# Patient Record
Sex: Male | Born: 1945 | Race: White | Hispanic: No | Marital: Married | Smoking: Former smoker
Health system: Southern US, Community
[De-identification: ages and names within clinical notes are randomized; demographics above are authoritative.]

## PROBLEM LIST (undated history)

## (undated) DIAGNOSIS — M1990 Unspecified osteoarthritis, unspecified site: Secondary | ICD-10-CM

## (undated) HISTORY — PX: PARTIAL COLECTOMY: SHX5273

## (undated) HISTORY — PX: JOINT REPLACEMENT: SHX530

## (undated) HISTORY — PX: TOTAL HIP ARTHROPLASTY: SHX124

---

## 1999-02-10 ENCOUNTER — Encounter: Payer: Self-pay | Admitting: *Deleted

## 1999-02-10 ENCOUNTER — Inpatient Hospital Stay (HOSPITAL_COMMUNITY): Admission: EM | Admit: 1999-02-10 | Discharge: 1999-02-13 | Payer: Self-pay | Admitting: *Deleted

## 1999-02-12 ENCOUNTER — Encounter: Payer: Self-pay | Admitting: *Deleted

## 1999-02-15 ENCOUNTER — Encounter: Payer: Self-pay | Admitting: Gastroenterology

## 1999-02-15 ENCOUNTER — Inpatient Hospital Stay (HOSPITAL_COMMUNITY): Admission: RE | Admit: 1999-02-15 | Discharge: 1999-02-20 | Payer: Self-pay | Admitting: *Deleted

## 1999-02-15 ENCOUNTER — Encounter: Payer: Self-pay | Admitting: *Deleted

## 1999-02-17 ENCOUNTER — Encounter: Payer: Self-pay | Admitting: *Deleted

## 1999-03-07 ENCOUNTER — Ambulatory Visit (HOSPITAL_COMMUNITY): Admission: RE | Admit: 1999-03-07 | Discharge: 1999-03-07 | Payer: Self-pay | Admitting: *Deleted

## 1999-03-20 ENCOUNTER — Inpatient Hospital Stay (HOSPITAL_COMMUNITY): Admission: RE | Admit: 1999-03-20 | Discharge: 1999-03-26 | Payer: Self-pay | Admitting: *Deleted

## 1999-03-29 ENCOUNTER — Inpatient Hospital Stay (HOSPITAL_COMMUNITY): Admission: EM | Admit: 1999-03-29 | Discharge: 1999-04-01 | Payer: Self-pay | Admitting: Emergency Medicine

## 1999-03-29 ENCOUNTER — Encounter: Payer: Self-pay | Admitting: General Surgery

## 1999-03-30 ENCOUNTER — Encounter: Payer: Self-pay | Admitting: General Surgery

## 1999-03-30 ENCOUNTER — Encounter: Payer: Self-pay | Admitting: Surgery

## 2000-12-23 ENCOUNTER — Other Ambulatory Visit: Admission: RE | Admit: 2000-12-23 | Discharge: 2000-12-23 | Payer: Self-pay | Admitting: Internal Medicine

## 2009-01-01 ENCOUNTER — Encounter: Admission: RE | Admit: 2009-01-01 | Discharge: 2009-01-01 | Payer: Self-pay | Admitting: Neurological Surgery

## 2009-03-12 ENCOUNTER — Encounter: Admission: RE | Admit: 2009-03-12 | Discharge: 2009-03-12 | Payer: Self-pay | Admitting: Internal Medicine

## 2010-06-03 ENCOUNTER — Encounter: Admission: RE | Admit: 2010-06-03 | Discharge: 2010-06-03 | Payer: Self-pay | Admitting: Neurological Surgery

## 2010-07-24 ENCOUNTER — Ambulatory Visit: Payer: Self-pay

## 2010-10-19 HISTORY — PX: TOTAL HIP ARTHROPLASTY: SHX124

## 2010-10-28 ENCOUNTER — Ambulatory Visit: Payer: Self-pay | Admitting: Unknown Physician Specialty

## 2010-10-30 ENCOUNTER — Inpatient Hospital Stay: Payer: Self-pay | Admitting: Unknown Physician Specialty

## 2014-06-27 ENCOUNTER — Other Ambulatory Visit: Payer: Self-pay | Admitting: Gastroenterology

## 2014-09-11 ENCOUNTER — Encounter (HOSPITAL_COMMUNITY): Payer: Self-pay | Admitting: *Deleted

## 2014-09-25 ENCOUNTER — Ambulatory Visit (HOSPITAL_COMMUNITY): Payer: Commercial Managed Care - HMO | Admitting: Anesthesiology

## 2014-09-25 ENCOUNTER — Encounter (HOSPITAL_COMMUNITY)
Admission: RE | Disposition: A | Discharge status: Home / Self Care | Payer: Medicare HMO | Source: Ambulatory Visit | Attending: Gastroenterology

## 2014-09-25 ENCOUNTER — Encounter (HOSPITAL_COMMUNITY): Payer: Self-pay | Admitting: *Deleted

## 2014-09-25 ENCOUNTER — Ambulatory Visit (HOSPITAL_COMMUNITY)
Admission: RE | Admit: 2014-09-25 | Discharge: 2014-09-25 | Disposition: A | Discharge status: Home / Self Care | Payer: Commercial Managed Care - HMO | Source: Ambulatory Visit | Attending: Gastroenterology | Admitting: Gastroenterology

## 2014-09-25 DIAGNOSIS — E780 Pure hypercholesterolemia: Secondary | ICD-10-CM | POA: Insufficient documentation

## 2014-09-25 DIAGNOSIS — M1990 Unspecified osteoarthritis, unspecified site: Secondary | ICD-10-CM | POA: Insufficient documentation

## 2014-09-25 DIAGNOSIS — Z87891 Personal history of nicotine dependence: Secondary | ICD-10-CM | POA: Diagnosis not present

## 2014-09-25 DIAGNOSIS — K5790 Diverticulosis of intestine, part unspecified, without perforation or abscess without bleeding: Secondary | ICD-10-CM | POA: Diagnosis not present

## 2014-09-25 DIAGNOSIS — Z1211 Encounter for screening for malignant neoplasm of colon: Secondary | ICD-10-CM | POA: Diagnosis present

## 2014-09-25 DIAGNOSIS — Z7982 Long term (current) use of aspirin: Secondary | ICD-10-CM | POA: Diagnosis not present

## 2014-09-25 DIAGNOSIS — D123 Benign neoplasm of transverse colon: Secondary | ICD-10-CM | POA: Insufficient documentation

## 2014-09-25 HISTORY — DX: Unspecified osteoarthritis, unspecified site: M19.90

## 2014-09-25 HISTORY — PX: COLONOSCOPY WITH PROPOFOL: SHX5780

## 2014-09-25 SURGERY — COLONOSCOPY WITH PROPOFOL
Anesthesia: Monitor Anesthesia Care

## 2014-09-25 MED ORDER — LACTATED RINGERS IV SOLN
INTRAVENOUS | Status: DC
  Administered 2014-09-25: 1000 mL via INTRAVENOUS

## 2014-09-25 MED ORDER — PROPOFOL 10 MG/ML IV BOLUS
INTRAVENOUS | Status: AC
  Filled 2014-09-25: qty 20

## 2014-09-25 MED ORDER — PROPOFOL 10 MG/ML IV EMUL
INTRAVENOUS | Status: DC | PRN
  Administered 2014-09-25: 50 mg via INTRAVENOUS

## 2014-09-25 MED ORDER — SODIUM CHLORIDE 0.9 % IV SOLN: INTRAVENOUS | Status: DC

## 2014-09-25 MED ORDER — LIDOCAINE HCL (CARDIAC) 20 MG/ML IV SOLN
INTRAVENOUS | Status: AC
Start: 2014-09-25 — End: 2014-09-25
  Filled 2014-09-25: qty 5

## 2014-09-25 MED ORDER — LACTATED RINGERS IV SOLN
INTRAVENOUS | Status: DC | PRN
  Administered 2014-09-25: 12:00:00 via INTRAVENOUS

## 2014-09-25 MED ORDER — PROPOFOL INFUSION 10 MG/ML OPTIME
INTRAVENOUS | Status: DC | PRN
  Administered 2014-09-25: 140 ug/kg/min via INTRAVENOUS

## 2014-09-25 SURGICAL SUPPLY — 21 items

## 2014-09-25 NOTE — H&P (Signed)
  Procedure: Surveillance colonoscopy. Colonoscopy with removal of a 3 mm ascending colon adenomatous polyp performed on 05/20/2009. Sigmoid colectomy for diverticular abscess performed in 2000  History: The patient is a 68 year old male born Jan 26, 1946. He is scheduled to undergo surveillance colonoscopy with polypectomy to prevent colon cancer.  Medication allergies: None  Past medical history: Hypercholesterolemia. Right hip replacement surgery in September 1999. Diffuse lumbar disc disease. Sigmoid colectomy to treat diverticular abscess. Bilateral hip replacement surgery.  Exam: The patient is alert and lying comfortably on the endoscopy stretcher. Abdomen is soft and nontender to palpation. Lungs are clear to auscultation. Cardiac exam reveals a regular rhythm.  Plan: Proceed with surveillance colonoscopy

## 2014-09-25 NOTE — Transfer of Care (Signed)
Immediate Anesthesia Transfer of Care Note  Patient: Antonio Ewing  Procedure(s) Performed: Procedure(s): COLONOSCOPY WITH PROPOFOL (N/A)  Patient Location: PACU  Anesthesia Type:MAC  Level of Consciousness: awake, alert  and oriented  Airway & Oxygen Therapy: Patient Spontanous Breathing and Patient connected to face mask oxygen  Post-op Assessment: Report given to PACU RN and Post -op Vital signs reviewed and stable  Post vital signs: Reviewed and stable  Complications: No apparent anesthesia complications

## 2014-09-25 NOTE — Op Note (Signed)
Procedure: Surveillance colonoscopy. Colonoscopy with removal of a 3 mm ascending colon adenomatous polyp performed on 05/20/2009. Sigmoid colectomy to treat diverticular abscess performed in 2000.  Endoscopist: Earle Gell  Premedication: Propofol administered by anesthesia  Procedure: The patient was placed in the left lateral decubitus position. Anal inspection and digital rectal exam were normal. The Pentax pediatric colonoscope was introduced into the rectum and advanced to the cecum. A normal-appearing ileocecal valve was identified. A normal-appearing appendiceal orifice was identified. Colonic preparation for the exam today was good. Withdrawal time was 10 minutes  Rectum. Normal. Retroflexed view of the distal rectum normal  Sigmoid colon and descending colon. Left colonic diverticulosis post sigmoid colon resection for diverticular abscess in 2000  Splenic flexure. Normal  Transverse colon. From the distal transverse colon, a 3 mm sessile polyp was removed with the cold biopsy forceps.  Hepatic flexure. Normal  Ascending colon. Normal  Cecum and ileocecal valve. Normal  Assessment: From the distal transverse colon, a 3 mm sessile polyp was removed with the cold biopsy forceps; otherwise normal surveillance colonoscopy  Recommendation: Schedule repeat surveillance colonoscopy in 5 years

## 2014-09-25 NOTE — Anesthesia Postprocedure Evaluation (Signed)
Anesthesia Post Note  Patient: Antonio Ewing  Procedure(s) Performed: Procedure(s) (LRB): COLONOSCOPY WITH PROPOFOL (N/A)  Anesthesia type: MAC  Patient location: PACU  Post pain: Pain level controlled  Post assessment: Post-op Vital signs reviewed  Last Vitals: BP 119/77 mmHg  Pulse 28  Temp(Src) 36.6 C (Oral)  Resp 19  Ht 5' 11.5" (1.816 m)  Wt 220 lb (99.791 kg)  BMI 30.26 kg/m2  SpO2 95%  Post vital signs: Reviewed  Level of consciousness: awake  Complications: No apparent anesthesia complications

## 2014-09-25 NOTE — Anesthesia Preprocedure Evaluation (Addendum)
Anesthesia Evaluation  Patient identified by MRN, date of birth, ID band Patient awake    Reviewed: Allergy & Precautions, H&P , NPO status , Patient's Chart, lab work & pertinent test results  Airway Mallampati: II  TM Distance: >3 FB Neck ROM: Full    Dental no notable dental hx.    Pulmonary neg pulmonary ROS, former smoker,  breath sounds clear to auscultation  Pulmonary exam normal       Cardiovascular negative cardio ROS  Rhythm:Regular Rate:Normal     Neuro/Psych negative neurological ROS  negative psych ROS   GI/Hepatic negative GI ROS, Neg liver ROS,   Endo/Other  negative endocrine ROS  Renal/GU negative Renal ROS     Musculoskeletal  (+) Arthritis -,   Abdominal   Peds  Hematology negative hematology ROS (+)   Anesthesia Other Findings   Reproductive/Obstetrics negative OB ROS                           Anesthesia Physical Anesthesia Plan  ASA: II  Anesthesia Plan: MAC   Post-op Pain Management:    Induction: Intravenous  Airway Management Planned:   Additional Equipment:   Intra-op Plan:   Post-operative Plan:   Informed Consent: I have reviewed the patients History and Physical, chart, labs and discussed the procedure including the risks, benefits and alternatives for the proposed anesthesia with the patient or authorized representative who has indicated his/her understanding and acceptance.   Dental advisory given  Plan Discussed with: CRNA  Anesthesia Plan Comments:         Anesthesia Quick Evaluation

## 2014-09-26 ENCOUNTER — Encounter (HOSPITAL_COMMUNITY): Payer: Self-pay | Admitting: Gastroenterology

## 2014-10-03 ENCOUNTER — Telehealth (HOSPITAL_COMMUNITY): Payer: Self-pay

## 2014-10-26 NOTE — Telephone Encounter (Signed)
Post op phone call follow up from December 2015.

## 2015-07-05 DIAGNOSIS — Z79899 Other long term (current) drug therapy: Secondary | ICD-10-CM | POA: Diagnosis not present

## 2015-07-05 DIAGNOSIS — Z1389 Encounter for screening for other disorder: Secondary | ICD-10-CM | POA: Diagnosis not present

## 2015-07-05 DIAGNOSIS — E782 Mixed hyperlipidemia: Secondary | ICD-10-CM | POA: Diagnosis not present

## 2015-07-05 DIAGNOSIS — E559 Vitamin D deficiency, unspecified: Secondary | ICD-10-CM | POA: Diagnosis not present

## 2015-07-05 DIAGNOSIS — Z86010 Personal history of colonic polyps: Secondary | ICD-10-CM | POA: Diagnosis not present

## 2015-07-05 DIAGNOSIS — E669 Obesity, unspecified: Secondary | ICD-10-CM | POA: Diagnosis not present

## 2015-07-05 DIAGNOSIS — M1990 Unspecified osteoarthritis, unspecified site: Secondary | ICD-10-CM | POA: Diagnosis not present

## 2015-07-05 DIAGNOSIS — Z0001 Encounter for general adult medical examination with abnormal findings: Secondary | ICD-10-CM | POA: Diagnosis not present

## 2016-01-22 DIAGNOSIS — R10814 Left lower quadrant abdominal tenderness: Secondary | ICD-10-CM | POA: Diagnosis not present

## 2016-03-02 DIAGNOSIS — H66006 Acute suppurative otitis media without spontaneous rupture of ear drum, recurrent, bilateral: Secondary | ICD-10-CM | POA: Diagnosis not present

## 2016-03-02 DIAGNOSIS — J0120 Acute ethmoidal sinusitis, unspecified: Secondary | ICD-10-CM | POA: Diagnosis not present

## 2016-07-15 DIAGNOSIS — Z6830 Body mass index (BMI) 30.0-30.9, adult: Secondary | ICD-10-CM | POA: Diagnosis not present

## 2016-07-15 DIAGNOSIS — Z125 Encounter for screening for malignant neoplasm of prostate: Secondary | ICD-10-CM | POA: Diagnosis not present

## 2016-07-15 DIAGNOSIS — M1990 Unspecified osteoarthritis, unspecified site: Secondary | ICD-10-CM | POA: Diagnosis not present

## 2016-07-15 DIAGNOSIS — E782 Mixed hyperlipidemia: Secondary | ICD-10-CM | POA: Diagnosis not present

## 2016-07-15 DIAGNOSIS — Z1389 Encounter for screening for other disorder: Secondary | ICD-10-CM | POA: Diagnosis not present

## 2016-07-15 DIAGNOSIS — Z79899 Other long term (current) drug therapy: Secondary | ICD-10-CM | POA: Diagnosis not present

## 2016-07-15 DIAGNOSIS — Z86010 Personal history of colonic polyps: Secondary | ICD-10-CM | POA: Diagnosis not present

## 2016-07-15 DIAGNOSIS — E669 Obesity, unspecified: Secondary | ICD-10-CM | POA: Diagnosis not present

## 2016-07-15 DIAGNOSIS — Z0000 Encounter for general adult medical examination without abnormal findings: Secondary | ICD-10-CM | POA: Diagnosis not present

## 2016-07-15 DIAGNOSIS — R030 Elevated blood-pressure reading, without diagnosis of hypertension: Secondary | ICD-10-CM | POA: Diagnosis not present

## 2016-07-15 DIAGNOSIS — E559 Vitamin D deficiency, unspecified: Secondary | ICD-10-CM | POA: Diagnosis not present

## 2016-09-29 DIAGNOSIS — E669 Obesity, unspecified: Secondary | ICD-10-CM | POA: Diagnosis not present

## 2016-09-29 DIAGNOSIS — E782 Mixed hyperlipidemia: Secondary | ICD-10-CM | POA: Diagnosis not present

## 2016-09-29 DIAGNOSIS — Z6828 Body mass index (BMI) 28.0-28.9, adult: Secondary | ICD-10-CM | POA: Diagnosis not present

## 2016-09-29 DIAGNOSIS — R030 Elevated blood-pressure reading, without diagnosis of hypertension: Secondary | ICD-10-CM | POA: Diagnosis not present

## 2016-09-29 DIAGNOSIS — R7309 Other abnormal glucose: Secondary | ICD-10-CM | POA: Diagnosis not present

## 2017-08-04 DIAGNOSIS — Z125 Encounter for screening for malignant neoplasm of prostate: Secondary | ICD-10-CM | POA: Diagnosis not present

## 2017-08-04 DIAGNOSIS — Z0001 Encounter for general adult medical examination with abnormal findings: Secondary | ICD-10-CM | POA: Diagnosis not present

## 2017-08-04 DIAGNOSIS — E559 Vitamin D deficiency, unspecified: Secondary | ICD-10-CM | POA: Diagnosis not present

## 2017-08-04 DIAGNOSIS — Z139 Encounter for screening, unspecified: Secondary | ICD-10-CM | POA: Diagnosis not present

## 2017-08-04 DIAGNOSIS — Z1389 Encounter for screening for other disorder: Secondary | ICD-10-CM | POA: Diagnosis not present

## 2017-08-04 DIAGNOSIS — M1990 Unspecified osteoarthritis, unspecified site: Secondary | ICD-10-CM | POA: Diagnosis not present

## 2017-08-04 DIAGNOSIS — Z79899 Other long term (current) drug therapy: Secondary | ICD-10-CM | POA: Diagnosis not present

## 2017-08-04 DIAGNOSIS — R7309 Other abnormal glucose: Secondary | ICD-10-CM | POA: Diagnosis not present

## 2017-08-04 DIAGNOSIS — E782 Mixed hyperlipidemia: Secondary | ICD-10-CM | POA: Diagnosis not present

## 2017-08-04 DIAGNOSIS — Z86010 Personal history of colonic polyps: Secondary | ICD-10-CM | POA: Diagnosis not present

## 2017-08-31 DIAGNOSIS — M62838 Other muscle spasm: Secondary | ICD-10-CM | POA: Diagnosis not present

## 2017-09-16 ENCOUNTER — Emergency Department: Payer: Medicare HMO

## 2017-09-16 ENCOUNTER — Emergency Department
Admission: EM | Admit: 2017-09-16 | Discharge: 2017-09-16 | Disposition: A | Discharge status: Home / Self Care | Payer: Medicare HMO | Attending: Emergency Medicine | Admitting: Emergency Medicine

## 2017-09-16 ENCOUNTER — Other Ambulatory Visit: Payer: Self-pay

## 2017-09-16 DIAGNOSIS — Z23 Encounter for immunization: Secondary | ICD-10-CM | POA: Insufficient documentation

## 2017-09-16 DIAGNOSIS — Y929 Unspecified place or not applicable: Secondary | ICD-10-CM | POA: Diagnosis not present

## 2017-09-16 DIAGNOSIS — Z87891 Personal history of nicotine dependence: Secondary | ICD-10-CM | POA: Insufficient documentation

## 2017-09-16 DIAGNOSIS — Y999 Unspecified external cause status: Secondary | ICD-10-CM | POA: Insufficient documentation

## 2017-09-16 DIAGNOSIS — S2241XA Multiple fractures of ribs, right side, initial encounter for closed fracture: Secondary | ICD-10-CM | POA: Insufficient documentation

## 2017-09-16 DIAGNOSIS — Y9389 Activity, other specified: Secondary | ICD-10-CM | POA: Diagnosis not present

## 2017-09-16 DIAGNOSIS — Z7982 Long term (current) use of aspirin: Secondary | ICD-10-CM | POA: Diagnosis not present

## 2017-09-16 DIAGNOSIS — M546 Pain in thoracic spine: Secondary | ICD-10-CM | POA: Diagnosis not present

## 2017-09-16 DIAGNOSIS — Z79899 Other long term (current) drug therapy: Secondary | ICD-10-CM | POA: Diagnosis not present

## 2017-09-16 DIAGNOSIS — Z96643 Presence of artificial hip joint, bilateral: Secondary | ICD-10-CM | POA: Insufficient documentation

## 2017-09-16 DIAGNOSIS — W1843XA Slipping, tripping and stumbling without falling due to stepping from one level to another, initial encounter: Secondary | ICD-10-CM | POA: Diagnosis not present

## 2017-09-16 DIAGNOSIS — S299XXA Unspecified injury of thorax, initial encounter: Secondary | ICD-10-CM | POA: Diagnosis present

## 2017-09-16 DIAGNOSIS — S2242XA Multiple fractures of ribs, left side, initial encounter for closed fracture: Secondary | ICD-10-CM | POA: Diagnosis not present

## 2017-09-16 MED ORDER — HYDROCODONE-ACETAMINOPHEN 5-325 MG PO TABS: 1.0000 | ORAL_TABLET | Freq: Four times a day (QID) | ORAL | 0 refills | Status: AC | PRN

## 2017-09-16 MED ORDER — TETANUS-DIPHTH-ACELL PERTUSSIS 5-2.5-18.5 LF-MCG/0.5 IM SUSP
0.5000 mL | Freq: Once | INTRAMUSCULAR | Status: AC
  Administered 2017-09-16: 0.5 mL via INTRAMUSCULAR
  Filled 2017-09-16: qty 0.5

## 2017-09-16 NOTE — ED Triage Notes (Signed)
Pt states he was standing on a pile of wood 5-6 feet off the ground cutting wood with a saw when he lost his balance and fell landing on his right side. Pt c/o thoracic back pain and right rib pain. Pt has a skin tear to the right hand. Denies any neck or other pain or sx.

## 2017-09-16 NOTE — ED Notes (Signed)
Patient in radiology at this time. Will assess once patient is back in room.

## 2017-09-16 NOTE — ED Provider Notes (Signed)
[facility] Emergency Department Provider Note   ____________________________________________   First MD Initiated Contact with Patient 09/16/17 1527     (approximate)  I have reviewed the triage vital signs and the nursing notes.   HISTORY  Chief Complaint Fall    HPI Antonio Ewing is a 71 y.o. male Patient was up on a woodpile cutting wood and slipped and fell about 5 or 6 feet. He landed on his right side on his chest. He has pain there he does not have any pain elsewhere. He did not hit his head or neck and has no pain there did not pass out. He has some pain with deep breathing in the area where he hit his chest. The pain is moderate can be sharp in the right mid chest made worse by movement or deep breathing   Past Medical History:  Diagnosis Date  . Arthritis     There are no active problems to display for this patient.   Past Surgical History:  Procedure Laterality Date  . COLONOSCOPY WITH PROPOFOL N/A 09/25/2014   Procedure: COLONOSCOPY WITH PROPOFOL;  Surgeon: Garlan Fair, MD;  Location: [facility] ENDOSCOPY;  Service: Endoscopy;  Laterality: N/A;  . JOINT REPLACEMENT Bilateral    hips replaced  . PARTIAL COLECTOMY  10 inches for diverticulitis  . TOTAL HIP ARTHROPLASTY     right 1999  . TOTAL HIP ARTHROPLASTY  2012   left 2012    Prior to Admission medications   Medication Sig Start Date End Date Taking? Authorizing Provider  aspirin EC 81 MG tablet Take 81 mg by mouth every morning.    [provider]  Cholecalciferol (VITAMIN D) 2000 UNITS CAPS Take 1 capsule by mouth every morning.    [provider]  glucosamine-chondroitin 500-400 MG tablet Take 2 tablets by mouth every morning.    [provider]  ibuprofen (ADVIL,MOTRIN) 200 MG tablet Take 400 mg by mouth every 6 (six) hours as needed for moderate pain.    [provider]  loratadine (CLARITIN) 10 MG tablet Take 10 mg by mouth daily as needed  for allergies.    [provider]  omeprazole (PRILOSEC OTC) 20 MG tablet Take 20 mg by mouth every morning.    [provider]  pravastatin (PRAVACHOL) 40 MG tablet Take 40 mg by mouth every morning.    [provider]    Allergies Patient has no known allergies.  History reviewed. No pertinent family history.  Social History Social History   Tobacco Use  . Smoking status: Former Smoker    Packs/day: 0.25    Years: 10.00    Pack years: 2.50    Types: Cigarettes    Last attempt to quit: 10/19/1966    Years since quitting: 50.9  . Smokeless tobacco: Never Used  Substance Use Topics  . Alcohol use: No  . Drug use: No    Review of Systems  Constitutional: No fever/chills Eyes: No visual changes. ENT: No sore throat. Cardiovascular: see history of present illness Respiratory: Denies shortness of breath! Gastrointestinal: No abdominal pain.  No nausea, no vomiting.  No diarrhea.  No constipation. Genitourinary: Negative for dysuria. Musculoskeletal: Negative for back pain. Skin: Negative for rash. Neurological: Negative for headaches, focal weakness  ____________________________________________   PHYSICAL EXAM:  VITAL SIGNS: ED Triage Vitals [09/16/17 1450]  Enc Vitals Group     BP (!) 147/92     Pulse Rate (!) 114  Resp 20     Temp 97.6 F (36.4 C)     Temp Source Oral     SpO2 97 %     Weight 198 lb (89.8 kg)     Height 5\' 11"  (1.803 m)     Head Circumference      Peak Flow      Pain Score 0     Pain Loc      Pain Edu?      Excl. in [place]?     Constitutional: Alert and oriented. Well appearing and in no acute distress. Eyes: Conjunctivae are normal. Head: Atraumatic. Nose: No congestion/rhinnorhea. Mouth/Throat: Mucous membranes are moist.  Oropharynx non-erythematous. Neck: No stridor. Cardiovascular: Normal rate, regular rhythm. Grossly normal heart sounds.  Good peripheral circulation. Respiratory: Normal respiratory  effort.  No retractions. Lungs CTAB.patient does have tenderness to palpation mid lateral right chest Gastrointestinal: Soft and nontender. No distention. No abdominal bruits. No CVA tenderness. Musculoskeletal: No lower extremity tenderness nor edema.  No joint effusions. Neurologic:  Normal speech and language. No gross focal neurologic deficits are appreciated. No gait instability. Skin:  Skin is warm, dry and intact except for 3 small skin tears/abrasions on the right hand dorsum. No rash noted.no bony tenderness in the hand. Good range of motion of all joints and good strength and sensation Psychiatric: Mood and affect are normal. Speech and behavior are normal.  ____________________________________________   LABS (all labs ordered are listed, but only abnormal results are displayed)  Labs Reviewed - No data to display ____________________________________________  EKG   ____________________________________________  RADIOLOGY  Dg Chest 2 View  Result Date: 09/16/2017 CLINICAL DATA:  71 year old male with a history of fall and back pain EXAM: CHEST  2 VIEW COMPARISON:  10/28/2010 FINDINGS: Cardiomediastinal silhouette within normal limits. No evidence of central vascular congestion. No pneumothorax or pleural effusion. No interlobular septal thickening. Minimally displaced right inferior rib fractures, 8 and 9. Patchy opacity at the right lung base adjacent to the fractured ribs. IMPRESSION: Minimally displaced rib fractures of the right eighth and ninth ribs. Patchy opacity at the right lung base most likely representing lung contusion or sequela of aspiration given the adjacent rib fractures and the history. Electronically Signed   By: Corrie Mckusick D.O.   On: 09/16/2017 15:37   Dg Thoracic Spine 2 View  Result Date: 09/16/2017 CLINICAL DATA:  Back pain after fall. EXAM: THORACIC SPINE 2 VIEWS COMPARISON:  Chest x-ray dated October 28, 2010. FINDINGS: There is no evidence of  thoracic spine fracture. Vertebral body heights are preserved. Alignment is normal. Mild multilevel degenerative changes throughout the thoracic spine. Moderate degenerative disc disease at C5-C6. IMPRESSION: No acute osseous abnormality. Electronically Signed   By: Titus Dubin M.D.   On: 09/16/2017 15:37   T-spine shows some arthritis but no acute changes or 2 nondisplaced rib fractures in the right chest in the area of the patient's pain. There may be [place] very small pulmonary contusion there as well. The patient however has absolutely no shortness of breath and is satting well. ____________________________________________   PROCEDURES  Procedure(s) performed:   Procedures  Critical Care performed:   ____________________________________________   INITIAL IMPRESSION / ASSESSMENT AND PLAN / ED COURSE  patient's medications include Motrin 200 mg when necessary. I discussed with the patient the need to return at once if he gets worse pain or shortness of breath fever coughing. To take 3 or 4 deep breaths 3 or 4 times a day usually  after the pain medicine has started to work. He can take 3 of the over-the-counter Motrin 3 times a day or we'll give him some Vicodin. He understands that the Motrin can upset his stomach or with prolonged use can affect his kidney function. He also understands that the pain medicine can make him woozy and he might fall and he understands if he can make him constipated.   nurse cleaned and dressed skin abrasion on the right hand  ____________________________________________   FINAL CLINICAL IMPRESSION(S) / ED DIAGNOSES  Final diagnoses:  Closed fracture of multiple ribs of right side, initial encounter     ED Discharge Orders    None       Note:  This document was prepared using Dragon voice recognition software and may include unintentional dictation errors.    Nena Polio, MD 09/16/17 [phone]

## 2017-09-16 NOTE — Discharge Instructions (Signed)
take Motrin 3 of the over-the-counter pills 3 times a day with food. If the pain is not controlled by that she can try some Vicodin one pill 4 times a day as needed. Be careful the Motrin Upsets Her Stomach and the Vicodin Can Make You Sleepy and Constipated. Do Not Fall into Taking the Vicodin and Do Not Drive. Do Not Rest in Bed but Do Not Do Any Really Strenuous Activities for A Few Weeks until the Ribs Heal Either.

## 2017-09-16 NOTE — ED Notes (Signed)
Pt was standing on top of a pile of logs cutting wood when he lost his balance and fell back landing on his back/side area. Pt stated that he could feel something popping in that area but is not in any pain unless he is moving.

## 2017-09-16 NOTE — ED Notes (Signed)
Dr. Malinda at bedside.  

## 2018-10-31 DIAGNOSIS — Z79899 Other long term (current) drug therapy: Secondary | ICD-10-CM | POA: Diagnosis not present

## 2018-10-31 DIAGNOSIS — B356 Tinea cruris: Secondary | ICD-10-CM | POA: Diagnosis not present

## 2018-10-31 DIAGNOSIS — M1990 Unspecified osteoarthritis, unspecified site: Secondary | ICD-10-CM | POA: Diagnosis not present

## 2018-10-31 DIAGNOSIS — R7309 Other abnormal glucose: Secondary | ICD-10-CM | POA: Diagnosis not present

## 2018-10-31 DIAGNOSIS — E559 Vitamin D deficiency, unspecified: Secondary | ICD-10-CM | POA: Diagnosis not present

## 2018-10-31 DIAGNOSIS — Z86010 Personal history of colonic polyps: Secondary | ICD-10-CM | POA: Diagnosis not present

## 2018-10-31 DIAGNOSIS — E782 Mixed hyperlipidemia: Secondary | ICD-10-CM | POA: Diagnosis not present

## 2018-10-31 DIAGNOSIS — Z0000 Encounter for general adult medical examination without abnormal findings: Secondary | ICD-10-CM | POA: Diagnosis not present

## 2018-10-31 DIAGNOSIS — Z1389 Encounter for screening for other disorder: Secondary | ICD-10-CM | POA: Diagnosis not present

## 2019-07-23 IMAGING — CR DG THORACIC SPINE 2V
3 series · 3 of 3 positions shown · non-contrast
Comparison: Chest x-ray dated October 28, 2010.

CLINICAL DATA: Back pain after fall.

EXAM:
THORACIC SPINE 2 VIEWS

[t-spine ap]
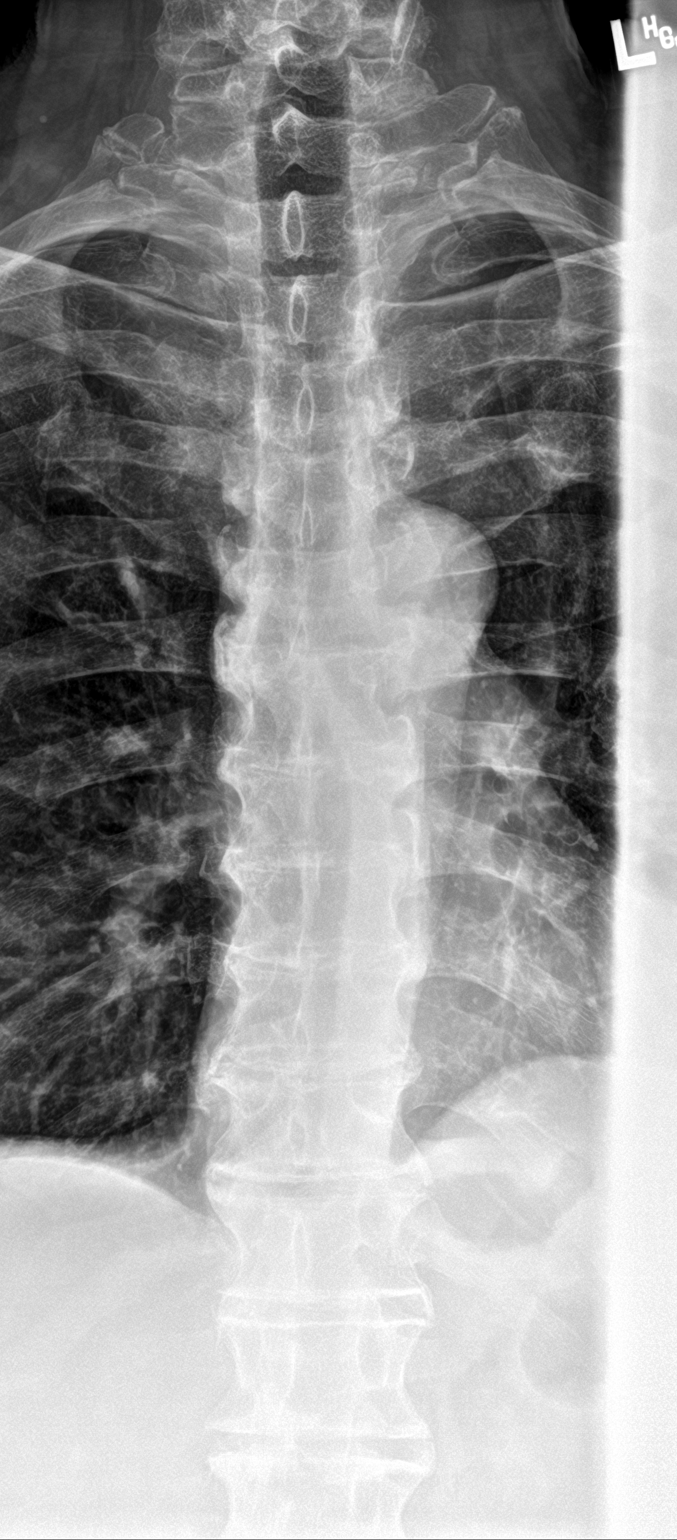

[t-spine lat]
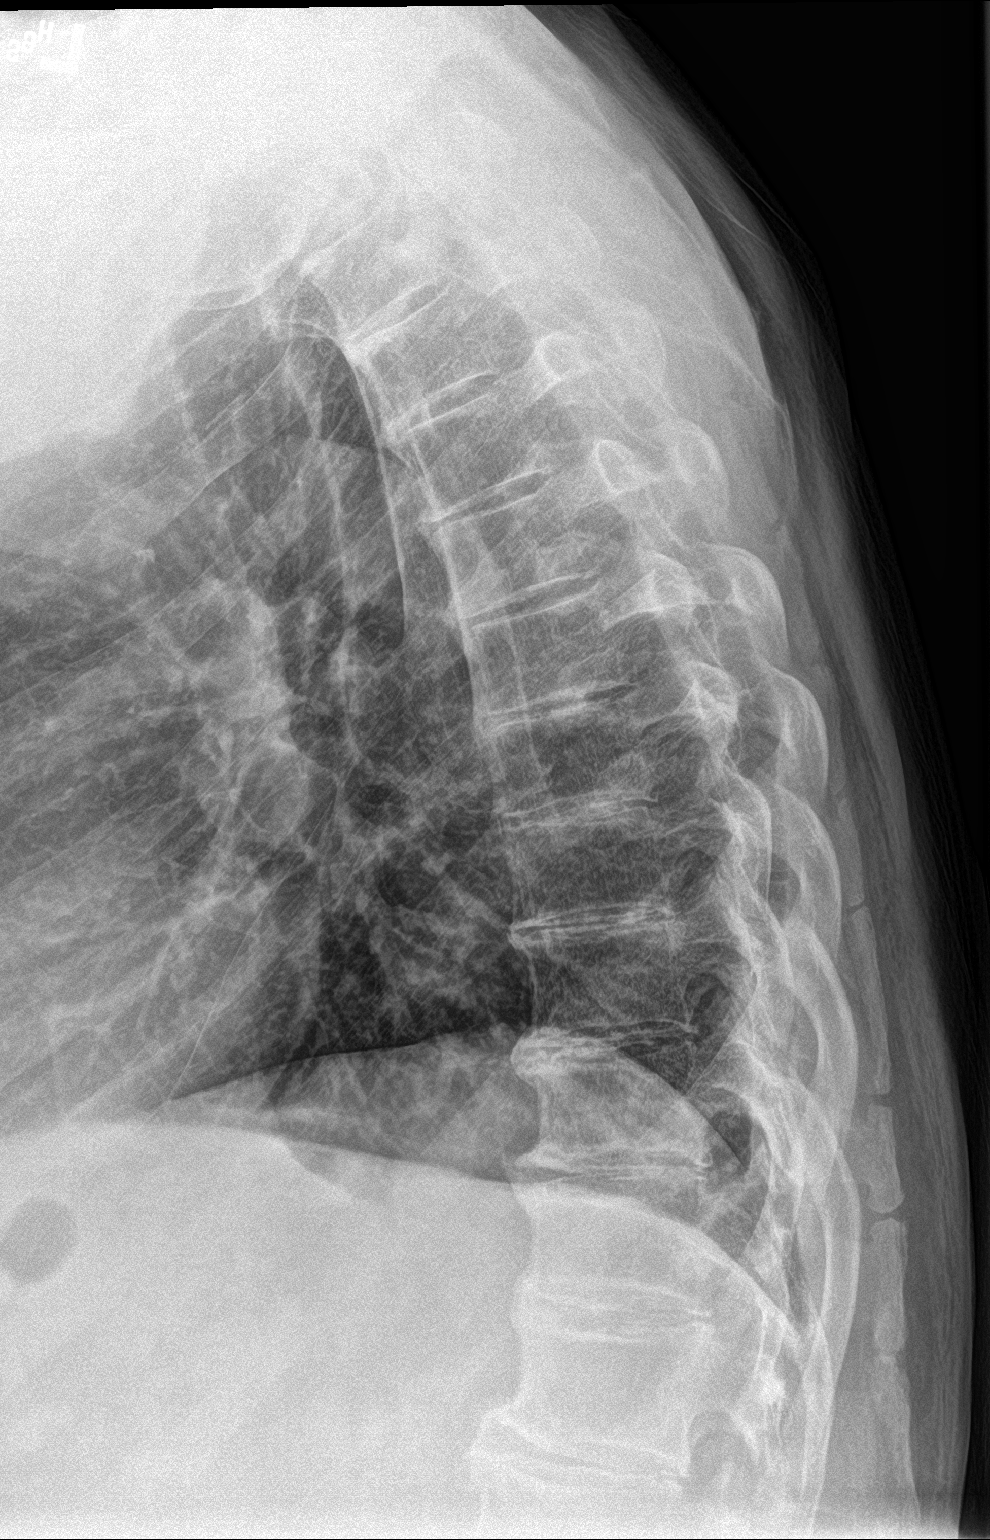

[t-spine swimmers]
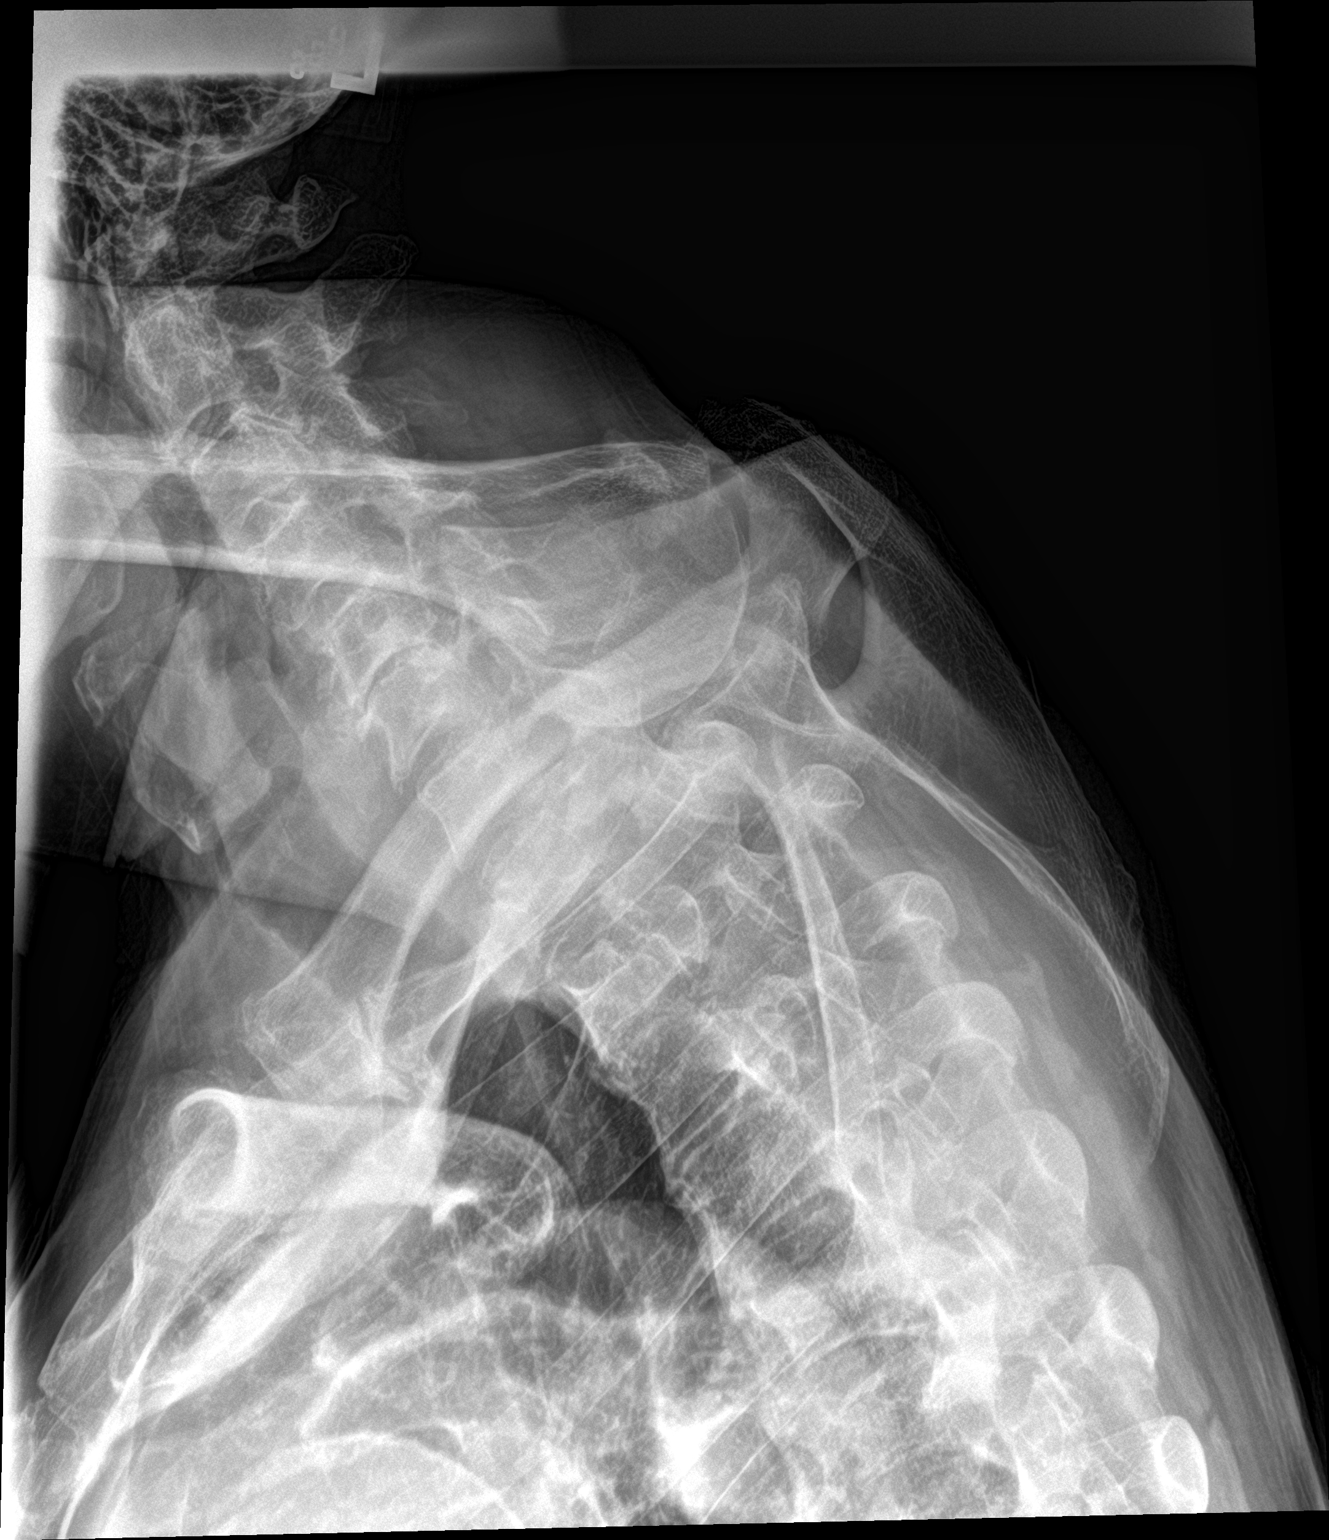

[3 of 3 positions shown; findings below may reference images not displayed]

FINDINGS: There is no evidence of thoracic spine fracture. Vertebral body
heights are preserved. Alignment is normal. Mild multilevel
degenerative changes throughout the thoracic spine. Moderate
degenerative disc disease at C5-C6.
IMPRESSION: No acute osseous abnormality.

## 2019-08-14 DIAGNOSIS — M25521 Pain in right elbow: Secondary | ICD-10-CM | POA: Diagnosis not present

## 2019-08-14 DIAGNOSIS — M19021 Primary osteoarthritis, right elbow: Secondary | ICD-10-CM | POA: Diagnosis not present

## 2019-09-21 ENCOUNTER — Other Ambulatory Visit: Payer: Self-pay

## 2019-09-21 DIAGNOSIS — Z20822 Contact with and (suspected) exposure to covid-19: Secondary | ICD-10-CM

## 2019-09-24 LAB — NOVEL CORONAVIRUS, NAA: SARS-CoV-2, NAA: DETECTED — AB

## 2019-09-25 ENCOUNTER — Telehealth: Payer: Self-pay | Admitting: Unknown Physician Specialty

## 2019-09-25 NOTE — Telephone Encounter (Signed)
Discussed with patient about Covid symptoms and the use of bamlanivimab, a monoclonal antibody infusion for those with mild to moderate Covid symptoms and at a high risk of hospitalization.  Pt has had symptoms for over a week and feeling better and is not a candidate for infusion at this time.

## 2019-11-08 DIAGNOSIS — Z0000 Encounter for general adult medical examination without abnormal findings: Secondary | ICD-10-CM | POA: Diagnosis not present

## 2019-11-08 DIAGNOSIS — E559 Vitamin D deficiency, unspecified: Secondary | ICD-10-CM | POA: Diagnosis not present

## 2019-11-08 DIAGNOSIS — Z1389 Encounter for screening for other disorder: Secondary | ICD-10-CM | POA: Diagnosis not present

## 2019-11-08 DIAGNOSIS — G5601 Carpal tunnel syndrome, right upper limb: Secondary | ICD-10-CM | POA: Diagnosis not present

## 2019-11-08 DIAGNOSIS — Z79899 Other long term (current) drug therapy: Secondary | ICD-10-CM | POA: Diagnosis not present

## 2019-11-08 DIAGNOSIS — Z86010 Personal history of colonic polyps: Secondary | ICD-10-CM | POA: Diagnosis not present

## 2019-11-08 DIAGNOSIS — M1990 Unspecified osteoarthritis, unspecified site: Secondary | ICD-10-CM | POA: Diagnosis not present

## 2019-11-08 DIAGNOSIS — E782 Mixed hyperlipidemia: Secondary | ICD-10-CM | POA: Diagnosis not present

## 2019-11-08 DIAGNOSIS — R7309 Other abnormal glucose: Secondary | ICD-10-CM | POA: Diagnosis not present

## 2020-01-01 DIAGNOSIS — M79641 Pain in right hand: Secondary | ICD-10-CM | POA: Diagnosis not present

## 2020-01-01 DIAGNOSIS — G5601 Carpal tunnel syndrome, right upper limb: Secondary | ICD-10-CM | POA: Diagnosis not present

## 2020-01-05 DIAGNOSIS — G5603 Carpal tunnel syndrome, bilateral upper limbs: Secondary | ICD-10-CM | POA: Diagnosis not present

## 2020-01-29 DIAGNOSIS — G5603 Carpal tunnel syndrome, bilateral upper limbs: Secondary | ICD-10-CM | POA: Diagnosis not present

## 2020-02-02 DIAGNOSIS — G5603 Carpal tunnel syndrome, bilateral upper limbs: Secondary | ICD-10-CM | POA: Diagnosis not present

## 2020-02-16 DIAGNOSIS — Z1159 Encounter for screening for other viral diseases: Secondary | ICD-10-CM | POA: Diagnosis not present

## 2020-02-21 DIAGNOSIS — K5730 Diverticulosis of large intestine without perforation or abscess without bleeding: Secondary | ICD-10-CM | POA: Diagnosis not present

## 2020-02-21 DIAGNOSIS — K648 Other hemorrhoids: Secondary | ICD-10-CM | POA: Diagnosis not present

## 2020-02-21 DIAGNOSIS — D123 Benign neoplasm of transverse colon: Secondary | ICD-10-CM | POA: Diagnosis not present

## 2020-02-21 DIAGNOSIS — Z86010 Personal history of colonic polyps: Secondary | ICD-10-CM | POA: Diagnosis not present

## 2020-02-21 DIAGNOSIS — D120 Benign neoplasm of cecum: Secondary | ICD-10-CM | POA: Diagnosis not present

## 2020-02-21 DIAGNOSIS — D122 Benign neoplasm of ascending colon: Secondary | ICD-10-CM | POA: Diagnosis not present

## 2020-02-23 DIAGNOSIS — D120 Benign neoplasm of cecum: Secondary | ICD-10-CM | POA: Diagnosis not present

## 2020-02-23 DIAGNOSIS — D122 Benign neoplasm of ascending colon: Secondary | ICD-10-CM | POA: Diagnosis not present

## 2020-02-23 DIAGNOSIS — D123 Benign neoplasm of transverse colon: Secondary | ICD-10-CM | POA: Diagnosis not present

## 2020-02-27 DIAGNOSIS — G5602 Carpal tunnel syndrome, left upper limb: Secondary | ICD-10-CM | POA: Diagnosis not present

## 2020-03-08 DIAGNOSIS — Z4789 Encounter for other orthopedic aftercare: Secondary | ICD-10-CM | POA: Diagnosis not present

## 2020-03-08 DIAGNOSIS — G5602 Carpal tunnel syndrome, left upper limb: Secondary | ICD-10-CM | POA: Diagnosis not present

## 2020-04-30 DIAGNOSIS — G5601 Carpal tunnel syndrome, right upper limb: Secondary | ICD-10-CM | POA: Diagnosis not present

## 2020-05-10 DIAGNOSIS — G5601 Carpal tunnel syndrome, right upper limb: Secondary | ICD-10-CM | POA: Diagnosis not present

## 2020-05-10 DIAGNOSIS — Z4789 Encounter for other orthopedic aftercare: Secondary | ICD-10-CM | POA: Diagnosis not present

## 2020-11-14 DIAGNOSIS — G5601 Carpal tunnel syndrome, right upper limb: Secondary | ICD-10-CM | POA: Diagnosis not present

## 2020-11-14 DIAGNOSIS — Z86010 Personal history of colonic polyps: Secondary | ICD-10-CM | POA: Diagnosis not present

## 2020-11-14 DIAGNOSIS — Z1389 Encounter for screening for other disorder: Secondary | ICD-10-CM | POA: Diagnosis not present

## 2020-11-14 DIAGNOSIS — E782 Mixed hyperlipidemia: Secondary | ICD-10-CM | POA: Diagnosis not present

## 2020-11-14 DIAGNOSIS — Z79899 Other long term (current) drug therapy: Secondary | ICD-10-CM | POA: Diagnosis not present

## 2020-11-14 DIAGNOSIS — M1990 Unspecified osteoarthritis, unspecified site: Secondary | ICD-10-CM | POA: Diagnosis not present

## 2020-11-14 DIAGNOSIS — Z0000 Encounter for general adult medical examination without abnormal findings: Secondary | ICD-10-CM | POA: Diagnosis not present

## 2020-11-14 DIAGNOSIS — E559 Vitamin D deficiency, unspecified: Secondary | ICD-10-CM | POA: Diagnosis not present

## 2020-11-14 DIAGNOSIS — R739 Hyperglycemia, unspecified: Secondary | ICD-10-CM | POA: Diagnosis not present

## 2020-12-12 DIAGNOSIS — G5601 Carpal tunnel syndrome, right upper limb: Secondary | ICD-10-CM | POA: Diagnosis not present

## 2020-12-23 DIAGNOSIS — G5601 Carpal tunnel syndrome, right upper limb: Secondary | ICD-10-CM | POA: Diagnosis not present

## 2020-12-27 DIAGNOSIS — G5601 Carpal tunnel syndrome, right upper limb: Secondary | ICD-10-CM | POA: Diagnosis not present

## 2021-06-25 DIAGNOSIS — H60501 Unspecified acute noninfective otitis externa, right ear: Secondary | ICD-10-CM | POA: Diagnosis not present

## 2021-11-19 DIAGNOSIS — Z1389 Encounter for screening for other disorder: Secondary | ICD-10-CM | POA: Diagnosis not present

## 2021-11-19 DIAGNOSIS — E782 Mixed hyperlipidemia: Secondary | ICD-10-CM | POA: Diagnosis not present

## 2021-11-19 DIAGNOSIS — Z79899 Other long term (current) drug therapy: Secondary | ICD-10-CM | POA: Diagnosis not present

## 2021-11-19 DIAGNOSIS — Z86010 Personal history of colonic polyps: Secondary | ICD-10-CM | POA: Diagnosis not present

## 2021-11-19 DIAGNOSIS — E559 Vitamin D deficiency, unspecified: Secondary | ICD-10-CM | POA: Diagnosis not present

## 2021-11-19 DIAGNOSIS — N400 Enlarged prostate without lower urinary tract symptoms: Secondary | ICD-10-CM | POA: Diagnosis not present

## 2021-11-19 DIAGNOSIS — M1990 Unspecified osteoarthritis, unspecified site: Secondary | ICD-10-CM | POA: Diagnosis not present

## 2021-11-19 DIAGNOSIS — Z7189 Other specified counseling: Secondary | ICD-10-CM | POA: Diagnosis not present

## 2021-11-19 DIAGNOSIS — G5601 Carpal tunnel syndrome, right upper limb: Secondary | ICD-10-CM | POA: Diagnosis not present

## 2021-11-19 DIAGNOSIS — R7309 Other abnormal glucose: Secondary | ICD-10-CM | POA: Diagnosis not present

## 2021-11-19 DIAGNOSIS — Z0000 Encounter for general adult medical examination without abnormal findings: Secondary | ICD-10-CM | POA: Diagnosis not present

## 2021-11-28 DIAGNOSIS — S46119A Strain of muscle, fascia and tendon of long head of biceps, unspecified arm, initial encounter: Secondary | ICD-10-CM | POA: Diagnosis not present

## 2021-12-02 DIAGNOSIS — M25512 Pain in left shoulder: Secondary | ICD-10-CM | POA: Diagnosis not present

## 2022-06-12 DIAGNOSIS — H6691 Otitis media, unspecified, right ear: Secondary | ICD-10-CM | POA: Diagnosis not present

## 2022-06-12 DIAGNOSIS — H60501 Unspecified acute noninfective otitis externa, right ear: Secondary | ICD-10-CM | POA: Diagnosis not present

## 2022-07-10 DIAGNOSIS — H6061 Unspecified chronic otitis externa, right ear: Secondary | ICD-10-CM | POA: Diagnosis not present

## 2022-08-03 DIAGNOSIS — H903 Sensorineural hearing loss, bilateral: Secondary | ICD-10-CM | POA: Diagnosis not present

## 2022-08-03 DIAGNOSIS — H9201 Otalgia, right ear: Secondary | ICD-10-CM | POA: Diagnosis not present

## 2022-08-03 DIAGNOSIS — H9313 Tinnitus, bilateral: Secondary | ICD-10-CM | POA: Diagnosis not present

## 2022-12-04 DIAGNOSIS — H903 Sensorineural hearing loss, bilateral: Secondary | ICD-10-CM | POA: Diagnosis not present

## 2022-12-11 DIAGNOSIS — H903 Sensorineural hearing loss, bilateral: Secondary | ICD-10-CM | POA: Diagnosis not present

## 2022-12-21 DIAGNOSIS — H60502 Unspecified acute noninfective otitis externa, left ear: Secondary | ICD-10-CM | POA: Diagnosis not present

## 2022-12-31 DIAGNOSIS — E782 Mixed hyperlipidemia: Secondary | ICD-10-CM | POA: Diagnosis not present

## 2022-12-31 DIAGNOSIS — Z1331 Encounter for screening for depression: Secondary | ICD-10-CM | POA: Diagnosis not present

## 2022-12-31 DIAGNOSIS — E119 Type 2 diabetes mellitus without complications: Secondary | ICD-10-CM | POA: Diagnosis not present

## 2022-12-31 DIAGNOSIS — G5601 Carpal tunnel syndrome, right upper limb: Secondary | ICD-10-CM | POA: Diagnosis not present

## 2022-12-31 DIAGNOSIS — M1990 Unspecified osteoarthritis, unspecified site: Secondary | ICD-10-CM | POA: Diagnosis not present

## 2022-12-31 DIAGNOSIS — Z79899 Other long term (current) drug therapy: Secondary | ICD-10-CM | POA: Diagnosis not present

## 2022-12-31 DIAGNOSIS — Z23 Encounter for immunization: Secondary | ICD-10-CM | POA: Diagnosis not present

## 2022-12-31 DIAGNOSIS — Z86010 Personal history of colonic polyps: Secondary | ICD-10-CM | POA: Diagnosis not present

## 2022-12-31 DIAGNOSIS — E559 Vitamin D deficiency, unspecified: Secondary | ICD-10-CM | POA: Diagnosis not present

## 2022-12-31 DIAGNOSIS — N400 Enlarged prostate without lower urinary tract symptoms: Secondary | ICD-10-CM | POA: Diagnosis not present

## 2022-12-31 DIAGNOSIS — Z0000 Encounter for general adult medical examination without abnormal findings: Secondary | ICD-10-CM | POA: Diagnosis not present

## 2023-07-08 DIAGNOSIS — Z86010 Personal history of colonic polyps: Secondary | ICD-10-CM | POA: Diagnosis not present

## 2023-07-08 DIAGNOSIS — E119 Type 2 diabetes mellitus without complications: Secondary | ICD-10-CM | POA: Diagnosis not present

## 2023-07-08 DIAGNOSIS — E559 Vitamin D deficiency, unspecified: Secondary | ICD-10-CM | POA: Diagnosis not present

## 2023-07-08 DIAGNOSIS — N400 Enlarged prostate without lower urinary tract symptoms: Secondary | ICD-10-CM | POA: Diagnosis not present

## 2023-07-08 DIAGNOSIS — E782 Mixed hyperlipidemia: Secondary | ICD-10-CM | POA: Diagnosis not present

## 2023-07-08 DIAGNOSIS — Z23 Encounter for immunization: Secondary | ICD-10-CM | POA: Diagnosis not present

## 2023-07-08 DIAGNOSIS — M1990 Unspecified osteoarthritis, unspecified site: Secondary | ICD-10-CM | POA: Diagnosis not present

## 2024-01-03 DIAGNOSIS — E782 Mixed hyperlipidemia: Secondary | ICD-10-CM | POA: Diagnosis not present

## 2024-01-03 DIAGNOSIS — E119 Type 2 diabetes mellitus without complications: Secondary | ICD-10-CM | POA: Diagnosis not present

## 2024-01-03 DIAGNOSIS — E559 Vitamin D deficiency, unspecified: Secondary | ICD-10-CM | POA: Diagnosis not present

## 2024-01-03 DIAGNOSIS — Z0000 Encounter for general adult medical examination without abnormal findings: Secondary | ICD-10-CM | POA: Diagnosis not present

## 2024-01-03 DIAGNOSIS — N400 Enlarged prostate without lower urinary tract symptoms: Secondary | ICD-10-CM | POA: Diagnosis not present

## 2024-01-03 DIAGNOSIS — Z79899 Other long term (current) drug therapy: Secondary | ICD-10-CM | POA: Diagnosis not present

## 2024-01-03 DIAGNOSIS — Z1389 Encounter for screening for other disorder: Secondary | ICD-10-CM | POA: Diagnosis not present

## 2024-01-03 DIAGNOSIS — G5601 Carpal tunnel syndrome, right upper limb: Secondary | ICD-10-CM | POA: Diagnosis not present

## 2024-01-03 DIAGNOSIS — H6123 Impacted cerumen, bilateral: Secondary | ICD-10-CM | POA: Diagnosis not present

## 2024-01-06 DIAGNOSIS — G5601 Carpal tunnel syndrome, right upper limb: Secondary | ICD-10-CM | POA: Diagnosis not present

## 2024-02-02 DIAGNOSIS — G5601 Carpal tunnel syndrome, right upper limb: Secondary | ICD-10-CM | POA: Diagnosis not present

## 2024-02-02 DIAGNOSIS — S6992XA Unspecified injury of left wrist, hand and finger(s), initial encounter: Secondary | ICD-10-CM | POA: Diagnosis not present

## 2024-02-02 DIAGNOSIS — E119 Type 2 diabetes mellitus without complications: Secondary | ICD-10-CM | POA: Diagnosis not present

## 2024-02-04 DIAGNOSIS — G5601 Carpal tunnel syndrome, right upper limb: Secondary | ICD-10-CM | POA: Diagnosis not present

## 2024-03-03 DIAGNOSIS — G5601 Carpal tunnel syndrome, right upper limb: Secondary | ICD-10-CM | POA: Diagnosis not present
